# Patient Record
Sex: Female | Born: 1990 | Race: Black or African American | Hispanic: No | Marital: Single | Smoking: Never smoker
Health system: Southern US, Community
[De-identification: ages and names within clinical notes are randomized; demographics above are authoritative.]

## PROBLEM LIST (undated history)

## (undated) HISTORY — PX: FINGER SURGERY: SHX640

## (undated) HISTORY — PX: BREAST SURGERY: SHX581

---

## 2010-01-20 ENCOUNTER — Emergency Department (HOSPITAL_COMMUNITY)
Admission: EM | Admit: 2010-01-20 | Discharge: 2010-01-20 | Payer: Self-pay | Source: Home / Self Care | Admitting: Family Medicine

## 2010-01-27 ENCOUNTER — Encounter
Admission: RE | Admit: 2010-01-27 | Discharge: 2010-01-27 | Payer: Self-pay | Source: Home / Self Care | Attending: Family Medicine | Admitting: Family Medicine

## 2010-02-26 ENCOUNTER — Ambulatory Visit (HOSPITAL_BASED_OUTPATIENT_CLINIC_OR_DEPARTMENT_OTHER)
Admission: RE | Admit: 2010-02-26 | Discharge: 2010-02-26 | Disposition: A | Discharge status: Home / Self Care | Payer: PRIVATE HEALTH INSURANCE | Source: Ambulatory Visit | Attending: Surgery | Admitting: Surgery

## 2010-02-26 ENCOUNTER — Other Ambulatory Visit: Payer: Self-pay | Admitting: Surgery

## 2010-02-26 DIAGNOSIS — D249 Benign neoplasm of unspecified breast: Secondary | ICD-10-CM | POA: Insufficient documentation

## 2010-02-26 DIAGNOSIS — Z01812 Encounter for preprocedural laboratory examination: Secondary | ICD-10-CM | POA: Insufficient documentation

## 2010-03-05 NOTE — Op Note (Signed)
  [name], [name] NO.:  [id number]  MEDICAL RECORD NO.:  [id number]          PATIENT TYPE:  EMS  LOCATION:  URG                          FACILITY:  [facility]  PHYSICIAN:  Abigail Miyamoto, M.D. DATE OF BIRTH:  [birth year]  DATE OF PROCEDURE:  02/26/2010 DATE OF DISCHARGE:  01/20/2010                              OPERATIVE REPORT   PREOPERATIVE DIAGNOSIS:  Right breast mass x2.  POSTOPERATIVE DIAGNOSIS:  Right breast mass x2.  PROCEDURE:  Excision of right breast mass x2.  SURGEON:  Abigail Miyamoto, MD  ANESTHESIA:  LMA and 0.25% Marcaine.  ESTIMATED BLOOD LOSS:  Minimal.  INDICATIONS:  [name] is a very pleasant 20 year old female who presents with a mass at the 1 and 2 o'clock position of the right breast.  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  Decision has been made to remove these areas larger for histologic evaluation.  PROCEDURE IN DETAIL:  The patient was brought to the operating room, identified as Ebony Simmons.  She was placed supine on the operative room table and anesthesia was induced.  Her right breast was then prepped and draped in the usual sterile fashion.  The 2 masses were adjacent to each other, so I was able to anesthetize the skin and make a incision between the two transversely on the breast at 1 to 2 o'clock position of the right breast.  I took this down through the breast tissue with electrocautery.  The larger mass was removed first and again was consistent with fibroadenoma.  The small breast mass which is adjacent to this and taken out as well with the electrocautery.  Both masses were then sent together to pathology for evaluation.  I was able to achieve hemostasis with cautery.  I then closed the subcutaneous tissue with interrupted 3-0 Vicryl sutures and closed the skin with a running 4-0 Monocryl.  Steri-Strips, gauze and Tegaderm were then applied.  The patient tolerated  the procedure well.  All sponge, needle, and instrument counts were correct at the end of the procedure.  The patient was then extubated in the operating room and taken in stable condition to the recovery room.     Abigail Miyamoto, M.D.     DB/MEDQ  D:  02/26/2010  T:  02/27/2010  Job:  [id number]  Electronically Signed by Abigail Miyamoto M.D. on 03/05/2010 01:36:19 PM

## 2011-07-21 ENCOUNTER — Encounter (HOSPITAL_COMMUNITY): Payer: Self-pay

## 2011-07-21 ENCOUNTER — Emergency Department (INDEPENDENT_AMBULATORY_CARE_PROVIDER_SITE_OTHER)
Admission: EM | Admit: 2011-07-21 | Discharge: 2011-07-21 | Disposition: A | Discharge status: Home / Self Care | Payer: PRIVATE HEALTH INSURANCE | Source: Home / Self Care | Attending: Emergency Medicine | Admitting: Emergency Medicine

## 2011-07-21 DIAGNOSIS — H9203 Otalgia, bilateral: Secondary | ICD-10-CM

## 2011-07-21 DIAGNOSIS — H9209 Otalgia, unspecified ear: Secondary | ICD-10-CM

## 2011-07-21 MED ORDER — FEXOFENADINE HCL 60 MG PO TABS: 60.0000 mg | ORAL_TABLET | Freq: Two times a day (BID) | ORAL | Status: AC

## 2011-07-21 MED ORDER — IBUPROFEN 400 MG PO TABS: 400.0000 mg | ORAL_TABLET | Freq: Four times a day (QID) | ORAL | Status: AC | PRN

## 2011-07-21 NOTE — ED Provider Notes (Signed)
History     CSN: [id number]  Arrival date & time 07/21/11  1559   First MD Initiated Contact with Patient 07/21/11 1600      Chief Complaint  Patient presents with  . Otalgia    (Consider location/radiation/quality/duration/timing/severity/associated sxs/prior treatment) HPI Comments: Patient presents to your care complaining of increased discomfort in her right ear but also discomfort in her from her left ear that have been going on for about 3-4 weeks. Patient denies any fevers, tinnitus, vertigo, headaches. She also denies any recent swimming or cleaning her years frequently. Denies some upper congestion that has come and gone and denies using any over-the-counter medicines as of decongestions. Has taking one or 2 doses of Tylenol or Motrin for her ear discomfort. Came in today as her right ear was bothering her a bit more.  Patient is a 21 y.o. female presenting with ear pain. The history is provided by the patient.  Otalgia This is a new problem. The current episode started more than 1 week ago. There is pain in both ears. The problem occurs constantly. The problem has been gradually worsening. There has been no fever. Pertinent negatives include no ear discharge, no headaches, no hearing loss, no rhinorrhea, no diarrhea, no vomiting, no neck pain, no cough and no rash. Her past medical history does not include chronic ear infection, hearing loss or tympanostomy tube.    History reviewed. No pertinent past medical history.  Past Surgical History  Procedure Date  . Breast surgery     History reviewed. No pertinent family history.  History  Substance Use Topics  . Smoking status: Never Smoker   . Smokeless tobacco: Not on file  . Alcohol Use: No    OB History    Grav Para Term Preterm Abortions TAB SAB Ect Mult Living                  Review of Systems  Constitutional: Negative for activity change and appetite change.  HENT: Positive for ear pain. Negative for hearing  loss, congestion, rhinorrhea, sneezing, neck pain, postnasal drip, tinnitus and ear discharge.   Respiratory: Negative for cough.   Gastrointestinal: Negative for vomiting and diarrhea.  Genitourinary: Positive for dysuria.  Skin: Negative for rash.  Neurological: Negative for headaches.    Allergies  Review of patient's allergies indicates no known allergies.  Home Medications   Current Outpatient Rx  Name Route Sig Dispense Refill  . FEXOFENADINE HCL 60 MG PO TABS Oral Take 1 tablet (60 mg total) by mouth 2 (two) times daily. 14 tablet 0  . IBUPROFEN 400 MG PO TABS Oral Take 1 tablet (400 mg total) by mouth every 6 (six) hours as needed for pain. 30 tablet 0    BP 117/73  Pulse 65  Temp 98.1 F (36.7 C) (Oral)  Resp 16  SpO2 100%  LMP 07/13/2011  Physical Exam  Nursing note and vitals reviewed. Constitutional: She appears well-developed and well-nourished.  HENT:  Head: Normocephalic.  Right Ear: Hearing, tympanic membrane, external ear and ear canal normal. No decreased hearing is noted.  Left Ear: Tympanic membrane, external ear and ear canal normal. No decreased hearing is noted.  Eyes: EOM are normal. Pupils are equal, round, and reactive to light.  Neck: Neck supple. No JVD present.  Lymphadenopathy:    She has no cervical adenopathy.  Skin: No rash noted.    ED Course  Procedures (including critical care time)  Labs Reviewed - No data to display  No results found.   1. Otalgia of both ears       MDM  Bilateral otalgia with a normal ear exam. Have encouraged patient to use Zyrtec for 2 weeks and to followup with primary care Dr. Tenny Craw the pain was to persist. Suspect most likely otalgia could be related to congestion of in her ear as patient has been having some upper respiratory symptoms that have been intermittent.     Jimmie Molly, MD 07/21/11 2125

## 2011-07-21 NOTE — ED Notes (Signed)
C/o irritation and sensitivity to both ears for weeks- worse for the last few days.  States rt is worse than lt ear

## 2014-02-27 ENCOUNTER — Emergency Department (INDEPENDENT_AMBULATORY_CARE_PROVIDER_SITE_OTHER)
Admission: EM | Admit: 2014-02-27 | Discharge: 2014-02-27 | Disposition: A | Discharge status: Home / Self Care | Payer: Self-pay | Source: Home / Self Care | Attending: Family Medicine | Admitting: Family Medicine

## 2014-02-27 ENCOUNTER — Encounter (HOSPITAL_COMMUNITY): Payer: Self-pay | Admitting: *Deleted

## 2014-02-27 DIAGNOSIS — K529 Noninfective gastroenteritis and colitis, unspecified: Secondary | ICD-10-CM

## 2014-02-27 MED ORDER — ONDANSETRON 4 MG PO TBDP
8.0000 mg | ORAL_TABLET | Freq: Once | ORAL | Status: AC
  Administered 2014-02-27: 8 mg via ORAL

## 2014-02-27 MED ORDER — ONDANSETRON 4 MG PO TBDP
ORAL_TABLET | ORAL | Status: AC
  Filled 2014-02-27: qty 2

## 2014-02-27 MED ORDER — ONDANSETRON HCL 4 MG PO TABS: 4.0000 mg | ORAL_TABLET | Freq: Four times a day (QID) | ORAL | Status: DC

## 2014-02-27 MED ORDER — DIPHENOXYLATE-ATROPINE 2.5-0.025 MG PO TABS: 2.0000 | ORAL_TABLET | Freq: Four times a day (QID) | ORAL | Status: DC | PRN

## 2014-02-27 NOTE — ED Notes (Signed)
Fluids   Tolerated    well

## 2014-02-27 NOTE — Discharge Instructions (Signed)
Clear liquid , bland diet today as tolerated, advance on wed as improved, use medicine as needed, return or see your doctor if any problems.

## 2014-02-27 NOTE — ED Notes (Signed)
Pt   Reports      Nausea      Vomiting   Diarrhea      And  Low    abd  Cramping  Which  Started        Last  Pm

## 2014-02-27 NOTE — ED Provider Notes (Signed)
CSN: [id number]     Arrival date & time 02/27/14  3612 History   First MD Initiated Contact with Patient 02/27/14 1002     Chief Complaint  Patient presents with  . Diarrhea   (Consider location/radiation/quality/duration/timing/severity/associated sxs/prior Treatment) Patient is a 24 y.o. female presenting with vomiting. The history is provided by the patient.  Emesis Severity:  Mild Duration:  8 hours Quality:  Stomach contents Progression:  Partially resolved Chronicity:  New Recent urination:  Normal Context comment:  Pt believes related to sandwich eaten at [employer] last eve,was nauseated and didn't taste right., awoke with n/v/d. Relieved by:  Nothing Worsened by:  Nothing tried Associated symptoms: diarrhea   Associated symptoms: no abdominal pain   Risk factors: suspect food intake   Risk factors: no sick contacts   Risk factors comment:  Felt fine prior to onset.   History reviewed. No pertinent past medical history. Past Surgical History  Procedure Laterality Date  . Breast surgery     History reviewed. No pertinent family history. History  Substance Use Topics  . Smoking status: Never Smoker   . Smokeless tobacco: Not on file  . Alcohol Use: No   OB History    No data available     Review of Systems  Constitutional: Positive for appetite change. Negative for fever.  Cardiovascular: Negative.   Gastrointestinal: Positive for nausea, vomiting and diarrhea. Negative for abdominal pain and blood in stool.    Allergies  Review of patient's allergies indicates no known allergies.  Home Medications   Prior to Admission medications   Medication Sig Start Date End Date Taking? Authorizing Provider  diphenoxylate-atropine (LOMOTIL) 2.5-0.025 MG per tablet Take 2 tablets by mouth 4 (four) times daily as needed for diarrhea or loose stools. 02/27/14   Billy Fischer, MD  fexofenadine (ALLEGRA) 60 MG tablet Take 1 tablet (60 mg total) by mouth 2 (two) times daily.  07/21/11 07/20/12  Rosana Hoes, MD  ondansetron (ZOFRAN) 4 MG tablet Take 1 tablet (4 mg total) by mouth every 6 (six) hours. Prn n/v. 02/27/14   Billy Fischer, MD   BP 139/93 mmHg  Pulse 96  Temp(Src) 98.9 F (37.2 C) (Oral)  Resp 16  SpO2 99%  LMP 02/20/2014 (Exact Date) Physical Exam  Constitutional: She is oriented to person, place, and time. She appears well-developed and well-nourished. No distress.  HENT:  Head: Normocephalic.  Mouth/Throat: Oropharynx is clear and moist.  Eyes: Conjunctivae and EOM are normal. Pupils are equal, round, and reactive to light.  Neck: Normal range of motion. Neck supple.  Cardiovascular: Normal heart sounds.   Pulmonary/Chest: Effort normal and breath sounds normal.  Abdominal: Soft. Bowel sounds are normal. She exhibits no distension and no mass. There is no tenderness. There is no rebound and no guarding.  Lymphadenopathy:    She has no cervical adenopathy.  Neurological: She is alert and oriented to person, place, and time.  Skin: Skin is warm and dry.  Nursing note and vitals reviewed.   ED Course  Procedures (including critical care time) Labs Review Labs Reviewed - No data to display  Imaging Review No results found.   MDM   1. Gastroenteritis, acute    Tolerating po at time of d/c.    Billy Fischer, MD 02/27/14 1101

## 2014-12-05 ENCOUNTER — Emergency Department (HOSPITAL_COMMUNITY)
Admission: EM | Admit: 2014-12-05 | Discharge: 2014-12-05 | Disposition: A | Discharge status: Home / Self Care | Payer: Self-pay | Source: Home / Self Care | Attending: Emergency Medicine | Admitting: Emergency Medicine

## 2014-12-05 ENCOUNTER — Encounter (HOSPITAL_COMMUNITY): Payer: Self-pay | Admitting: Emergency Medicine

## 2014-12-05 DIAGNOSIS — L03019 Cellulitis of unspecified finger: Secondary | ICD-10-CM

## 2014-12-05 NOTE — Discharge Instructions (Signed)
You have a kind of infection in your finger. Please go directly to Dr. Luanna Cole office for additional evaluation.

## 2014-12-05 NOTE — ED Provider Notes (Signed)
CSN: [id number]     Arrival date & time 12/05/14  1408 History   First MD Initiated Contact with Patient 12/05/14 1534     Chief Complaint  Patient presents with  . Hand Pain  . Joint Swelling   (Consider location/radiation/quality/duration/timing/severity/associated sxs/prior Treatment) HPI  She is a 24 year old woman here for evaluation of right middle finger swelling and pain. She states it started yesterday with some swelling and erythema at the distal part of her finger. It has gotten much worse today. She is unable to extend her finger due to pain. She states the swelling and redness is extending down into the middle phalanx. She denies any fevers or chills. She does bite her nails.  History reviewed. No pertinent past medical history. Past Surgical History  Procedure Laterality Date  . Breast surgery     History reviewed. No pertinent family history. Social History  Substance Use Topics  . Smoking status: Never Smoker   . Smokeless tobacco: None  . Alcohol Use: No   OB History    No data available     Review of Systems As in history of present illness Allergies  Review of patient's allergies indicates no known allergies.  Home Medications   Prior to Admission medications   Medication Sig Start Date End Date Taking? Authorizing Provider  diphenoxylate-atropine (LOMOTIL) 2.5-0.025 MG per tablet Take 2 tablets by mouth 4 (four) times daily as needed for diarrhea or loose stools. 02/27/14   Billy Fischer, MD  fexofenadine (ALLEGRA) 60 MG tablet Take 1 tablet (60 mg total) by mouth 2 (two) times daily. 07/21/11 07/20/12  Rosana Hoes, MD  ondansetron (ZOFRAN) 4 MG tablet Take 1 tablet (4 mg total) by mouth every 6 (six) hours. Prn n/v. 02/27/14   Billy Fischer, MD   Meds Ordered and Administered this Visit  Medications - No data to display  BP 124/86 mmHg  Pulse 69  Temp(Src) 98.6 F (37 C) (Oral)  Resp 18  SpO2 100%  LMP 11/01/2014 No data found.   Physical Exam   Constitutional: She is oriented to person, place, and time. She appears well-developed and well-nourished. No distress.  Cardiovascular: Normal rate.   Pulmonary/Chest: Effort normal.  Musculoskeletal:  Right middle finger: There is significant erythema and swelling to the pulp of the finger. This is extending down over the palmar aspect of the middle phalanx. She is holding her finger in flexion. I am able to fully extend her finger, but with significant pain.  Neurological: She is alert and oriented to person, place, and time.    ED Course  Procedures (including critical care time)  Labs Review Labs Reviewed - No data to display  Imaging Review No results found.    MDM   1. Felon of finger    Discussed with Dr. Mardelle Matte, hand surgeon on call. Will see her in his office this afternoon. Provided directions and instructions to go directly to his office for evaluation of her infection.    Melony Overly, MD 12/05/14 1556

## 2014-12-05 NOTE — ED Notes (Signed)
The patient presented to the [facility] with the complaint of a swollen middle finger on her right hand. The patient stated that she did not remember any trauma to the hand or finger.

## 2015-04-13 ENCOUNTER — Emergency Department (INDEPENDENT_AMBULATORY_CARE_PROVIDER_SITE_OTHER)
Admission: EM | Admit: 2015-04-13 | Discharge: 2015-04-13 | Disposition: A | Discharge status: Home / Self Care | Payer: No Typology Code available for payment source | Source: Home / Self Care | Attending: Emergency Medicine | Admitting: Emergency Medicine

## 2015-04-13 ENCOUNTER — Encounter (HOSPITAL_COMMUNITY): Payer: Self-pay | Admitting: Emergency Medicine

## 2015-04-13 DIAGNOSIS — B354 Tinea corporis: Secondary | ICD-10-CM

## 2015-04-13 MED ORDER — CLOTRIMAZOLE-BETAMETHASONE 1-0.05 % EX CREA: TOPICAL_CREAM | CUTANEOUS | Status: AC

## 2015-04-13 NOTE — Discharge Instructions (Signed)
You have ringworm. Use the cream twice a day until the rash is resolved plus a few extra days.  This usually takes 1-2 weeks. The spot on your leg is likely just dry skin. Keep it covered when at work, but otherwise leave it open. Follow-up as needed.

## 2015-04-13 NOTE — ED Provider Notes (Signed)
CSN: [id number]     Arrival date & time 04/13/15  1306 History   First MD Initiated Contact with Patient 04/13/15 1327     Chief Complaint  Patient presents with  . Rash   (Consider location/radiation/quality/duration/timing/severity/associated sxs/prior Treatment) HPI  She is a 25 year old woman here for evaluation of rash. She reports a spot to her right temple that showed up a few days ago and has gradually been getting bigger. 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  She has been using OTC clotrimazole cream for a few days.  History reviewed. No pertinent past medical history. Past Surgical History  Procedure Laterality Date  . Breast surgery     No family history on file. Social History  Substance Use Topics  . Smoking status: Never Smoker   . Smokeless tobacco: None  . Alcohol Use: No   OB History    No data available     Review of Systems As in history of present illness Allergies  Review of patient's allergies indicates no known allergies.  Home Medications   Prior to Admission medications   Medication Sig Start Date End Date Taking? Authorizing Provider  clotrimazole-betamethasone (LOTRISONE) cream Apply to affected area 2 times daily 04/13/15   Melony Overly, MD  diphenoxylate-atropine (LOMOTIL) 2.5-0.025 MG per tablet Take 2 tablets by mouth 4 (four) times daily as needed for diarrhea or loose stools. 02/27/14   Billy Fischer, MD  fexofenadine (ALLEGRA) 60 MG tablet Take 1 tablet (60 mg total) by mouth 2 (two) times daily. 07/21/11 07/20/12  Rosana Hoes, MD  ondansetron (ZOFRAN) 4 MG tablet Take 1 tablet (4 mg total) by mouth every 6 (six) hours. Prn n/v. 02/27/14   Billy Fischer, MD   Meds Ordered and Administered this Visit  Medications - No data to display  BP 108/61 mmHg  Pulse 75  Temp(Src) 98.1 F (36.7 C) (Oral)  Resp 16  SpO2 100%  LMP 04/05/2015 No data found.   Physical Exam  Constitutional: She is oriented to person, place,  and time. She appears well-developed and well-nourished. No distress.  Cardiovascular: Normal rate.   Pulmonary/Chest: Effort normal.  Neurological: She is alert and oriented to person, place, and time.  Skin: Rash (she has a 0.5 x 1 cm annular lesion to right temple. The edges slightly erythematous with some central clearing. There are 2 punctate satellite lesions just inferior.Marland Kitchen) noted.  She also has a patch of dry scaly skin on her left inner thigh.    ED Course  Procedures (including critical care time)  Labs Review Labs Reviewed - No data to display  Imaging Review No results found.    MDM   1. Tinea corporis    Treat with clotrimazole/betamethasone cream. Discussed she will need to use this cream for 1-2 weeks before it resolves. Follow-up as needed.    Melony Overly, MD 04/13/15 [phone]

## 2015-04-13 NOTE — ED Notes (Signed)
C/o rash on right side of face and on left inner thigh onset x1 week.... 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 No acute distress.

## 2015-04-29 ENCOUNTER — Ambulatory Visit (HOSPITAL_COMMUNITY)
Admission: EM | Admit: 2015-04-29 | Discharge: 2015-04-29 | Disposition: A | Discharge status: Home / Self Care | Payer: No Typology Code available for payment source | Attending: Family Medicine | Admitting: Family Medicine

## 2015-04-29 ENCOUNTER — Encounter (HOSPITAL_COMMUNITY): Payer: Self-pay | Admitting: Emergency Medicine

## 2015-04-29 DIAGNOSIS — H109 Unspecified conjunctivitis: Secondary | ICD-10-CM

## 2015-04-29 MED ORDER — POLYMYXIN B-TRIMETHOPRIM 10000-0.1 UNIT/ML-% OP SOLN: 1.0000 [drp] | OPHTHALMIC | Status: DC

## 2015-04-29 NOTE — ED Notes (Signed)
The patient presented to the [facility] with a complaint of her right eye draining and itching since Saturday.

## 2015-04-29 NOTE — Discharge Instructions (Signed)
Bacterial Conjunctivitis °Bacterial conjunctivitis, commonly called pink eye, is an inflammation of the clear membrane that covers the white part of the eye (conjunctiva). The inflammation can also happen on the underside of the eyelids. The blood vessels in the conjunctiva become inflamed, causing the eye to become red or pink. Bacterial conjunctivitis may spread easily from one eye to another and from person to person (contagious).  °CAUSES  °Bacterial conjunctivitis is caused by bacteria. The bacteria may come from your own skin, your upper respiratory tract, or from someone else with bacterial conjunctivitis. °SYMPTOMS  °The normally white color of the eye or the underside of the eyelid is usually pink or red. The pink eye is usually associated with irritation, tearing, and some sensitivity to light. Bacterial conjunctivitis is often associated with a thick, yellowish discharge from the eye. The discharge may turn into a crust on the eyelids overnight, which causes your eyelids to stick together. If a discharge is present, there may also be some blurred vision in the affected eye. °DIAGNOSIS  °Bacterial conjunctivitis is diagnosed by your caregiver through an eye exam and the symptoms that you report. Your caregiver looks for changes in the surface tissues of your eyes, which may point to the specific type of conjunctivitis. A sample of any discharge may be collected on a cotton-tip swab if you have a severe case of conjunctivitis, if your cornea is affected, or if you keep getting repeat infections that do not respond to treatment. The sample will be sent to a lab to see if the inflammation is caused by a bacterial infection and to see if the infection will respond to antibiotic medicines. °TREATMENT  °1. Bacterial conjunctivitis is treated with antibiotics. Antibiotic eyedrops are most often used. However, antibiotic ointments are also available. Antibiotics pills are sometimes used. Artificial tears or eye  washes may ease discomfort. °HOME CARE INSTRUCTIONS  °1. To ease discomfort, apply a cool, clean washcloth to your eye for 10-20 minutes, 3-4 times a day. °2. Gently wipe away any drainage from your eye with a warm, wet washcloth or a cotton ball. °3. Wash your hands often with soap and water. Use paper towels to dry your hands. °4. Do not share towels or washcloths. This may spread the infection. °5. Change or wash your pillowcase every day. °6. You should not use eye makeup until the infection is gone. °7. Do not operate machinery or drive if your vision is blurred. °8. Stop using contact lenses. Ask your caregiver how to sterilize or replace your contacts before using them again. This depends on the type of contact lenses that you use. °9. When applying medicine to the infected eye, do not touch the edge of your eyelid with the eyedrop bottle or ointment tube. °SEEK IMMEDIATE MEDICAL CARE IF:  °· Your infection has not improved within 3 days after beginning treatment. °· You had yellow discharge from your eye and it returns. °· You have increased eye pain. °· Your eye redness is spreading. °· Your vision becomes blurred. °· You have a fever or persistent symptoms for more than 2-3 days. °· You have a fever and your symptoms suddenly get worse. °· You have facial pain, redness, or swelling. °MAKE SURE YOU:  °· Understand these instructions. °· Will watch your condition. °· Will get help right away if you are not doing well or get worse. °  °This information is not intended to replace advice given to you by your health care provider. Make sure you   discuss any questions you have with your health care provider. °  °Document Released: 12/29/2004 Document Revised: 01/19/2014 Document Reviewed: 06/01/2011 °Elsevier Interactive Patient Education ©2016 [employer] ° °How to Use Eye Drops and Eye Ointments °HOW TO APPLY EYE DROPS °Follow these steps when applying eye drops: °2. Wash your hands. °3. Tilt your head  back. °4. Put a finger under your eye and use it to gently pull your lower lid downward. Keep that finger in place. °5. Using your other hand, hold the dropper between your thumb and index finger. °6. Position the dropper just over the edge of the lower lid. Hold it as close to your eye as you can without touching the dropper to your eye. °7. Steady your hand. One way to do this is to lean your index finger against your brow. °8. Look up. °9. Slowly and gently squeeze one drop of medicine into your eye. °10. Close your eye. °11. Place a finger between your lower eyelid and your nose. Press gently for 2 minutes. This increases the amount of time that the medicine is exposed to the eye. It also reduces side effects that can develop if the drop gets into the bloodstream through the nose. °HOW TO APPLY EYE OINTMENTS °Follow these steps when applying eye ointments: °10. Wash your hands. °11. Put a finger under your eye and use it to gently pull your lower lid downward. Keep that finger in place. °12. Using your other hand, place the tip of the tube between your thumb and index finger with the remaining fingers braced against your cheek or nose. °13. Hold the tube just over the edge of your lower lid without touching the tube to your lid or eyeball. °14. Look up. °15. Line the inner part of your lower lid with ointment. °16. Gently pull up on your upper lid and look down. This will force the ointment to spread over the surface of the eye. °17. Release the upper lid. °18. If you can, close your eyes for 1-2 minutes. °Do not rub your eyes. If you applied the ointment correctly, your vision will be blurry for a few minutes. This is normal. °ADDITIONAL INFORMATION °· Make sure to use the eye drops or ointment as told by your health care provider. °· If you have been told to use both eye drops and an eye ointment, apply the eye drops first, then wait 3-4 minutes before you apply the ointment. °· Try not to touch the tip of the  dropper or tube to your eye. A dropper or tube that has touched the eye can become contaminated. °  °This information is not intended to replace advice given to you by your health care provider. Make sure you discuss any questions you have with your health care provider. °  °Document Released: 04/06/2000 Document Revised: 05/15/2014 Document Reviewed: 12/25/2013 °Elsevier Interactive Patient Education ©2016 [employer] ° °

## 2015-04-30 NOTE — ED Provider Notes (Signed)
CSN: [id number]     Arrival date & time 04/29/15  1923 History   First MD Initiated Contact with Patient 04/29/15 2043     Chief Complaint  Patient presents with  . 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 Is here for treatment, right eye very itchy. No acute vision change.  History reviewed. No pertinent past medical history. Past Surgical History  Procedure Laterality Date  . Breast surgery     History reviewed. No pertinent family history. Social History  Substance Use Topics  . Smoking status: Never Smoker   . Smokeless tobacco: None  . Alcohol Use: No   OB History    No data available     Review of Systems Pink eye Allergies  Review of patient's allergies indicates no known allergies.  Home Medications   Prior to Admission medications   Medication Sig Start Date End Date Taking? Authorizing Provider  clotrimazole-betamethasone (LOTRISONE) cream Apply to affected area 2 times daily 04/13/15   Melony Overly, MD  diphenoxylate-atropine (LOMOTIL) 2.5-0.025 MG per tablet Take 2 tablets by mouth 4 (four) times daily as needed for diarrhea or loose stools. 02/27/14   Billy Fischer, MD  fexofenadine (ALLEGRA) 60 MG tablet Take 1 tablet (60 mg total) by mouth 2 (two) times daily. 07/21/11 07/20/12  Rosana Hoes, MD  ondansetron (ZOFRAN) 4 MG tablet Take 1 tablet (4 mg total) by mouth every 6 (six) hours. Prn n/v. 02/27/14   Billy Fischer, MD  trimethoprim-polymyxin b (POLYTRIM) ophthalmic solution Place 1 drop into the right eye every 4 (four) hours. 04/29/15   Konrad Felix, PA   Meds Ordered and Administered this Visit  Medications - No data to display  BP 118/67 mmHg  Pulse 70  Temp(Src) 98.8 F (37.1 C) (Oral)  Resp 18  SpO2 98%  LMP 04/05/2015 No data found.   Physical Exam NURSES NOTES AND VITAL SIGNS  REVIEWED. CONSTITUTIONAL: Well developed, well nourished, no acute distress HEENT: normocephalic, atraumatic EYES: Left Conjunctiva normal Right eye red crusty NECK:normal ROM, supple, no adenopathy PULMONARY:No respiratory distress, normal effort MUSCULOSKELETAL: Normal ROM of all extremities,  SKIN: warm and dry without rash PSYCHIATRIC: Mood and affect, behavior are normal  ED Course  Procedures (including critical care time)  Labs Review Labs Reviewed - No data to display  Imaging Review No results found.   Visual Acuity Review  Right Eye Distance:   Left Eye Distance:   Bilateral Distance:    Right Eye Near:   Left Eye Near:    Bilateral Near:     RX polytrim    MDM   1. Conjunctivitis of right eye     Patient is reassured that there are no issues that require transfer to higher level of care at this time or additional tests. Patient is advised to continue home symptomatic treatment. Patient is advised that if there are new or worsening symptoms to attend the emergency department, contact primary care provider, or return to UC. Instructions of care provided discharged home in stable condition.    THIS NOTE WAS GENERATED USING A VOICE RECOGNITION SOFTWARE PROGRAM. ALL REASONABLE EFFORTS  WERE MADE TO PROOFREAD THIS DOCUMENT FOR ACCURACY.  I have verbally reviewed the discharge instructions with the patient. A printed AVS was given to the patient.  All questions were answered prior to discharge.      Pilar Grammes  [place], [place] 04/30/15 [phone]

## 2015-06-17 ENCOUNTER — Encounter (HOSPITAL_COMMUNITY): Payer: Self-pay | Admitting: *Deleted

## 2015-06-17 ENCOUNTER — Ambulatory Visit (HOSPITAL_COMMUNITY)
Admission: EM | Admit: 2015-06-17 | Discharge: 2015-06-17 | Disposition: A | Discharge status: Home / Self Care | Payer: No Typology Code available for payment source | Attending: Family Medicine | Admitting: Family Medicine

## 2015-06-17 DIAGNOSIS — J0101 Acute recurrent maxillary sinusitis: Secondary | ICD-10-CM | POA: Diagnosis not present

## 2015-06-17 DIAGNOSIS — H6091 Unspecified otitis externa, right ear: Secondary | ICD-10-CM

## 2015-06-17 MED ORDER — NEOMYCIN-POLYMYXIN-HC 3.5-10000-1 OT SUSP: 4.0000 [drp] | Freq: Three times a day (TID) | OTIC | Status: DC

## 2015-06-17 MED ORDER — DOXYCYCLINE HYCLATE 100 MG PO CAPS: 100.0000 mg | ORAL_CAPSULE | Freq: Two times a day (BID) | ORAL | Status: DC

## 2015-06-17 MED ORDER — IPRATROPIUM BROMIDE 0.06 % NA SOLN: 2.0000 | Freq: Four times a day (QID) | NASAL | Status: AC

## 2015-06-17 NOTE — ED Provider Notes (Signed)
CSN: [id number]     Arrival date & time 06/17/15  1757 History   First MD Initiated Contact with Patient 06/17/15 1825     Chief Complaint  Patient presents with  . Otalgia   (Consider location/radiation/quality/duration/timing/severity/associated sxs/prior Treatment) Patient is a 25 y.o. female presenting with ear pain. The history is provided by the patient.  Otalgia Location:  Right Behind ear:  No abnormality Quality:  Burning and sore Severity:  Mild Onset quality:  Gradual Duration:  2 weeks Chronicity:  New Context comment:  Cleaning ear with bobbypin Relieved by:  None tried Worsened by:  Nothing tried Ineffective treatments:  None tried Associated symptoms: congestion and rhinorrhea   Associated symptoms: no ear discharge and no fever     History reviewed. No pertinent past medical history. Past Surgical History  Procedure Laterality Date  . Breast surgery     History reviewed. No pertinent family history. Social History  Substance Use Topics  . Smoking status: Never Smoker   . Smokeless tobacco: None  . Alcohol Use: No   OB History    No data available     Review of Systems  Constitutional: Negative.  Negative for fever.  HENT: Positive for congestion, ear pain, postnasal drip and rhinorrhea. Negative for ear discharge and sinus pressure.   All other systems reviewed and are negative.   Allergies  Review of patient's allergies indicates no known allergies.  Home Medications   Prior to Admission medications   Medication Sig Start Date End Date Taking? Authorizing Provider  clotrimazole-betamethasone (LOTRISONE) cream Apply to affected area 2 times daily 04/13/15   Melony Overly, MD  diphenoxylate-atropine (LOMOTIL) 2.5-0.025 MG per tablet Take 2 tablets by mouth 4 (four) times daily as needed for diarrhea or loose stools. 02/27/14   Billy Fischer, MD  doxycycline (VIBRAMYCIN) 100 MG capsule Take 1 capsule (100 mg total) by mouth 2 (two) times daily. 06/17/15    Billy Fischer, MD  fexofenadine (ALLEGRA) 60 MG tablet Take 1 tablet (60 mg total) by mouth 2 (two) times daily. 07/21/11 07/20/12  Rosana Hoes, MD  ipratropium (ATROVENT) 0.06 % nasal spray Place 2 sprays into both nostrils 4 (four) times daily. 06/17/15   Billy Fischer, MD  neomycin-polymyxin-hydrocortisone (CORTISPORIN) 3.5-10000-1 otic suspension Place 4 drops into the right ear 3 (three) times daily. 06/17/15   Billy Fischer, MD  ondansetron (ZOFRAN) 4 MG tablet Take 1 tablet (4 mg total) by mouth every 6 (six) hours. Prn n/v. 02/27/14   Billy Fischer, MD  trimethoprim-polymyxin b (POLYTRIM) ophthalmic solution Place 1 drop into the right eye every 4 (four) hours. 04/29/15   Konrad Felix, PA   Meds Ordered and Administered this Visit  Medications - No data to display  BP 120/84 mmHg  Pulse 78  Temp(Src) 98.6 F (37 C) (Oral)  Resp 18  SpO2 100%  LMP 05/17/2015 No data found.   Physical Exam  Constitutional: She is oriented to person, place, and time. She appears well-developed and well-nourished.  HENT:  Right Ear: Tympanic membrane normal. There is tenderness. Tympanic membrane is not injected, not perforated and not erythematous.  Left Ear: Hearing, tympanic membrane, external ear and ear canal normal.  Mouth/Throat: No oropharyngeal exudate.  Eyes: Pupils are equal, round, and reactive to light.  Neck: Normal range of motion. Neck supple.  Lymphadenopathy:    She has no cervical adenopathy.  Neurological: She is alert and oriented to person, place, and time.  Skin: Skin is warm and dry.  Nursing note and vitals reviewed.   ED Course  Procedures (including critical care time)  Labs Review Labs Reviewed - No data to display  Imaging Review No results found.   Visual Acuity Review  Right Eye Distance:   Left Eye Distance:   Bilateral Distance:    Right Eye Near:   Left Eye Near:    Bilateral Near:         MDM   1. External otitis of right ear   2. Acute  recurrent maxillary sinusitis        Billy Fischer, MD 06/17/15 [phone]

## 2015-06-17 NOTE — ED Notes (Signed)
Pt  Reports earache       Since  Last  Week  As   Well  As   Nasal  Congestion  /  Sinus  Drainage          Symptoms   Since  Last      Week    Pt  Sitting  Upright on  The  Exam table  Speaking in  Complete  sentances  And is in no  Acute  Distress

## 2015-11-10 ENCOUNTER — Encounter (HOSPITAL_COMMUNITY): Payer: Self-pay | Admitting: *Deleted

## 2015-11-10 ENCOUNTER — Ambulatory Visit (HOSPITAL_COMMUNITY)
Admission: EM | Admit: 2015-11-10 | Discharge: 2015-11-10 | Disposition: A | Discharge status: Home / Self Care | Payer: Self-pay | Attending: Internal Medicine | Admitting: Internal Medicine

## 2015-11-10 DIAGNOSIS — J069 Acute upper respiratory infection, unspecified: Secondary | ICD-10-CM

## 2015-11-10 DIAGNOSIS — R6889 Other general symptoms and signs: Secondary | ICD-10-CM

## 2015-11-10 NOTE — ED Provider Notes (Signed)
CSN: [id number]     Arrival date & time 11/10/15  1555 History   None    Chief Complaint  Patient presents with  . URI   (Consider location/radiation/quality/duration/timing/severity/associated sxs/prior Treatment) HPI NP THIS IS A NEW PROBLEM PT PRESENTS WITH SEVERAL DAY HISTORY OF CONGESTION, RUNNY NOSE, COUGH AND WHEEZING. HAS USED MULTIPLE OTC MEDS WITHOUT RELIEF OF SYMPTOMS. DENIES FEVER. COUGH IS DRY NON PRODUCTIVE. NON SMOKER.  PREVIOUS SYMPTOMS OF THIS NATURE. OTHERS AROUND PATIENT HAS BEEN ILL. SOME CHEST PAIN DUE TO COUGH. PAIN SCORE 2.    History reviewed. No pertinent past medical history. Past Surgical History:  Procedure Laterality Date  . BREAST SURGERY     History reviewed. No pertinent family history. Social History  Substance Use Topics  . Smoking status: Never Smoker  . Smokeless tobacco: Not on file  . Alcohol use No   OB History    No data available     Review of Systems  Denies: HEADACHE, NAUSEA, ABDOMINAL PAIN, CHEST PAIN, CONGESTION, DYSURIA, SHORTNESS OF BREATH  Allergies  Review of patient's allergies indicates no known allergies.  Home Medications   Prior to Admission medications   Medication Sig Start Date End Date Taking? Authorizing Provider  clotrimazole-betamethasone (LOTRISONE) cream Apply to affected area 2 times daily 04/13/15   Melony Overly, MD  diphenoxylate-atropine (LOMOTIL) 2.5-0.025 MG per tablet Take 2 tablets by mouth 4 (four) times daily as needed for diarrhea or loose stools. 02/27/14   Billy Fischer, MD  doxycycline (VIBRAMYCIN) 100 MG capsule Take 1 capsule (100 mg total) by mouth 2 (two) times daily. 06/17/15   Billy Fischer, MD  fexofenadine (ALLEGRA) 60 MG tablet Take 1 tablet (60 mg total) by mouth 2 (two) times daily. 07/21/11 07/20/12  Rosana Hoes, MD  ipratropium (ATROVENT) 0.06 % nasal spray Place 2 sprays into both nostrils 4 (four) times daily. 06/17/15   Billy Fischer, MD  neomycin-polymyxin-hydrocortisone (CORTISPORIN)  3.5-10000-1 otic suspension Place 4 drops into the right ear 3 (three) times daily. 06/17/15   Billy Fischer, MD  ondansetron (ZOFRAN) 4 MG tablet Take 1 tablet (4 mg total) by mouth every 6 (six) hours. Prn n/v. 02/27/14   Billy Fischer, MD  trimethoprim-polymyxin b (POLYTRIM) ophthalmic solution Place 1 drop into the right eye every 4 (four) hours. 04/29/15   Konrad Felix, PA   Meds Ordered and Administered this Visit  Medications - No data to display  BP 114/82 (BP Location: Right Arm)   Pulse 82   Temp 98.4 F (36.9 C) (Oral)   Resp 16   LMP 10/11/2015   SpO2 100%  No data found.   Physical Exam NURSES NOTES AND VITAL SIGNS REVIEWED. CONSTITUTIONAL: Well developed, well nourished, no acute distress HEENT: normocephalic, atraumatic EYES: Conjunctiva normal NECK:normal ROM, supple, no adenopathy PULMONARY:No respiratory distress, normal effort ABDOMINAL: Soft, ND, NT BS+, No CVAT MUSCULOSKELETAL: Normal ROM of all extremities,  SKIN: warm and dry without rash PSYCHIATRIC: Mood and affect, behavior are normal  Urgent Care Course   Clinical Course    Procedures (including critical care time)  Labs Review Labs Reviewed - No data to display  Imaging Review No results found.   Visual Acuity Review  Right Eye Distance:   Left Eye Distance:   Bilateral Distance:    Right Eye Near:   Left Eye Near:    Bilateral Near:         MDM   1. Acute upper respiratory infection  2. Flu-like symptoms     Patient is reassured that there are no issues that require transfer to higher level of care at this time or additional tests. Patient is advised to continue home symptomatic treatment. Patient is advised that if there are new or worsening symptoms to attend the emergency department, contact primary care provider, or return to UC. Instructions of care provided discharged home in stable condition.    THIS NOTE WAS GENERATED USING A VOICE RECOGNITION SOFTWARE PROGRAM.  ALL REASONABLE EFFORTS  WERE MADE TO PROOFREAD THIS DOCUMENT FOR ACCURACY.  I have verbally reviewed the discharge instructions with the patient. A printed AVS was given to the patient.  All questions were answered prior to discharge.      Konrad Felix, PA 11/10/15 2100

## 2015-11-10 NOTE — ED Triage Notes (Signed)
r  Earache    Chills         Headache     bodyaches     Slight   sorethroat     Symptoms    Started  Yesterday

## 2015-11-10 NOTE — Discharge Instructions (Signed)
SYMPTOMATIC TREATMENT AT HOME  PUSH FLUIDS,   MOTRIN AS NEEDED FOR BODY ACHES.

## 2015-12-22 ENCOUNTER — Ambulatory Visit (HOSPITAL_COMMUNITY)
Admission: EM | Admit: 2015-12-22 | Discharge: 2015-12-22 | Disposition: A | Discharge status: Home / Self Care | Payer: Self-pay | Attending: Family Medicine | Admitting: Family Medicine

## 2015-12-22 ENCOUNTER — Encounter (HOSPITAL_COMMUNITY): Payer: Self-pay | Admitting: Emergency Medicine

## 2015-12-22 DIAGNOSIS — K047 Periapical abscess without sinus: Secondary | ICD-10-CM

## 2015-12-22 MED ORDER — CLINDAMYCIN HCL 150 MG PO CAPS: 150.0000 mg | ORAL_CAPSULE | Freq: Four times a day (QID) | ORAL | 0 refills | Status: DC

## 2015-12-22 NOTE — ED Provider Notes (Signed)
[facility]    CSN: [id number] Arrival date & time: 12/22/15  1632     History   Chief Complaint Chief Complaint  Patient presents with  . Dental Pain    HPI Ebony Simmons is a 25 y.o. female.   The history is provided by the patient and a parent.  Dental Pain  Location:  Lower Lower teeth location:  18/LL 2nd molar Quality:  Throbbing Severity:  Moderate Onset quality:  Gradual Duration:  2 days Progression:  Worsening Chronicity:  New Context: abscess   Relieved by:  None tried Worsened by:  Nothing Ineffective treatments:  None tried Associated symptoms: facial swelling and gum swelling   Associated symptoms: no fever     History reviewed. No pertinent past medical history.  There are no active problems to display for this patient.   Past Surgical History:  Procedure Laterality Date  . BREAST SURGERY      OB History    No data available       Home Medications    Prior to Admission medications   Medication Sig Start Date End Date Taking? Authorizing Provider  clotrimazole-betamethasone (LOTRISONE) cream Apply to affected area 2 times daily 04/13/15   Melony Overly, MD  diphenoxylate-atropine (LOMOTIL) 2.5-0.025 MG per tablet Take 2 tablets by mouth 4 (four) times daily as needed for diarrhea or loose stools. 02/27/14   Billy Fischer, MD  doxycycline (VIBRAMYCIN) 100 MG capsule Take 1 capsule (100 mg total) by mouth 2 (two) times daily. 06/17/15   Billy Fischer, MD  fexofenadine (ALLEGRA) 60 MG tablet Take 1 tablet (60 mg total) by mouth 2 (two) times daily. 07/21/11 07/20/12  Rosana Hoes, MD  ipratropium (ATROVENT) 0.06 % nasal spray Place 2 sprays into both nostrils 4 (four) times daily. 06/17/15   Billy Fischer, MD  neomycin-polymyxin-hydrocortisone (CORTISPORIN) 3.5-10000-1 otic suspension Place 4 drops into the right ear 3 (three) times daily. 06/17/15   Billy Fischer, MD  ondansetron (ZOFRAN) 4 MG tablet Take 1 tablet (4 mg total) by mouth every 6  (six) hours. Prn n/v. 02/27/14   Billy Fischer, MD  trimethoprim-polymyxin b (POLYTRIM) ophthalmic solution Place 1 drop into the right eye every 4 (four) hours. 04/29/15   Konrad Felix, PA    Family History No family history on file.  Social History Social History  Substance Use Topics  . Smoking status: Never Smoker  . Smokeless tobacco: Never Used  . Alcohol use No     Allergies   Patient has no known allergies.   Review of Systems Review of Systems  Constitutional: Negative for fever.  HENT: Positive for dental problem and facial swelling.   All other systems reviewed and are negative.    Physical Exam Triage Vital Signs ED Triage Vitals  Enc Vitals Group     BP 12/22/15 1658 126/83     Pulse Rate 12/22/15 1658 86     Resp 12/22/15 1658 16     Temp 12/22/15 1658 97.5 F (36.4 C)     Temp Source 12/22/15 1658 Oral     SpO2 12/22/15 1658 100 %     Weight 12/22/15 1659 110 lb (49.9 kg)     Height 12/22/15 1659 5\' 6"  (1.676 m)     Head Circumference --      Peak Flow --      Pain Score 12/22/15 1700 10     Pain Loc --  Pain Edu? --      Excl. in [place]? --    No data found.   Updated Vital Signs BP 126/83   Pulse 86   Temp 97.5 F (36.4 C) (Oral)   Resp 16   Ht 5\' 6"  (1.676 m)   Wt 110 lb (49.9 kg)   LMP 12/11/2015   SpO2 100%   BMI 17.75 kg/m   Visual Acuity Right Eye Distance:   Left Eye Distance:   Bilateral Distance:    Right Eye Near:   Left Eye Near:    Bilateral Near:     Physical Exam  Constitutional: She appears well-developed and well-nourished. She appears distressed.  HENT:  Mouth/Throat: Oropharynx is clear and moist and mucous membranes are normal. Dental abscesses present.       UC Treatments / Results  Labs (all labs ordered are listed, but only abnormal results are displayed) Labs Reviewed - No data to display  EKG  EKG Interpretation None       Radiology No results found.  Procedures Procedures  (including critical care time)  Medications Ordered in UC Medications - No data to display   Initial Impression / Assessment and Plan / UC Course  I have reviewed the triage vital signs and the nursing notes.  Pertinent labs & imaging results that were available during my care of the patient were reviewed by me and considered in my medical decision making (see chart for details).  Clinical Course       Final Clinical Impressions(s) / UC Diagnoses   Final diagnoses:  None    New Prescriptions New Prescriptions   No medications on file     Billy Fischer, MD 12/22/15 1746

## 2015-12-22 NOTE — ED Triage Notes (Signed)
PT reports severe pain in left lower jaw that radiates up to left ear. PT reports an abscess over gum in affected area that is draining. PT reports the abscess has been there for a "while"

## 2015-12-22 NOTE — Discharge Instructions (Signed)
Take medicine as prescribed, see your dentist as soon as possible °

## 2015-12-27 ENCOUNTER — Ambulatory Visit (HOSPITAL_COMMUNITY)
Admission: EM | Admit: 2015-12-27 | Discharge: 2015-12-27 | Disposition: A | Discharge status: Home / Self Care | Payer: No Typology Code available for payment source | Attending: Internal Medicine | Admitting: Internal Medicine

## 2015-12-27 ENCOUNTER — Encounter (HOSPITAL_COMMUNITY): Payer: Self-pay | Admitting: Emergency Medicine

## 2015-12-27 DIAGNOSIS — K047 Periapical abscess without sinus: Secondary | ICD-10-CM

## 2015-12-27 MED ORDER — AMOXICILLIN 875 MG PO TABS: 875.0000 mg | ORAL_TABLET | Freq: Two times a day (BID) | ORAL | 0 refills | Status: DC

## 2015-12-27 NOTE — ED Triage Notes (Signed)
The patient presented to the [facility] with a complaint of oral swelling that started 6 days. The patient reported that she was here on 12/22/2015 for a dental abscess and was prescribed clindamycin. The patient reported that she took the medicine for 4 days and began to have swelling in her mouth and soft tissue areas and was advised by a pharmacist to stop the medicine.

## 2015-12-27 NOTE — ED Provider Notes (Signed)
CSN: [id number]     Arrival date & time 12/27/15  1048 History   First MD Initiated Contact with Patient 12/27/15 1210     Chief Complaint  Patient presents with  . Oral Swelling   (Consider location/radiation/quality/duration/timing/severity/associated sxs/prior Treatment) Patient states she was tx'd for dental abscess and was rx'd clindamycin and her jaw swelled up more and then she has pain in her mouth and she was advised to stop the clindamycin and she states she has felt better.   The history is provided by the patient.  Dental Pain  Location:  Upper and lower Lower teeth location:  18/LL 2nd molar Quality:  Aching Severity:  Moderate Onset quality:  Sudden Duration:  4 days Timing:  Constant Progression:  Improving Chronicity:  New Context: abscess   Previous work-up:  Dental exam Relieved by:  Nothing Worsened by:  Nothing   History reviewed. No pertinent past medical history. Past Surgical History:  Procedure Laterality Date  . BREAST SURGERY     History reviewed. No pertinent family history. Social History  Substance Use Topics  . Smoking status: Never Smoker  . Smokeless tobacco: Never Used  . Alcohol use No   OB History    No data available     Review of Systems  Constitutional: Negative.   HENT: Positive for dental problem.   Eyes: Negative.   Respiratory: Negative.   Cardiovascular: Negative.   Gastrointestinal: Negative.   Endocrine: Negative.   Genitourinary: Negative.   Musculoskeletal: Negative.   Skin: Negative.   Allergic/Immunologic: Negative.   Neurological: Negative.   Hematological: Negative.   Psychiatric/Behavioral: Negative.     Allergies  Patient has no known allergies.  Home Medications   Prior to Admission medications   Medication Sig Start Date End Date Taking? Authorizing Provider  clindamycin (CLEOCIN) 150 MG capsule Take 1 capsule (150 mg total) by mouth 4 (four) times daily. 12/22/15  Yes Billy Fischer, MD   amoxicillin (AMOXIL) 875 MG tablet Take 1 tablet (875 mg total) by mouth 2 (two) times daily. 12/27/15   Lysbeth Penner, FNP  clotrimazole-betamethasone (LOTRISONE) cream Apply to affected area 2 times daily 04/13/15   Melony Overly, MD  diphenoxylate-atropine (LOMOTIL) 2.5-0.025 MG per tablet Take 2 tablets by mouth 4 (four) times daily as needed for diarrhea or loose stools. 02/27/14   Billy Fischer, MD  doxycycline (VIBRAMYCIN) 100 MG capsule Take 1 capsule (100 mg total) by mouth 2 (two) times daily. 06/17/15   Billy Fischer, MD  fexofenadine (ALLEGRA) 60 MG tablet Take 1 tablet (60 mg total) by mouth 2 (two) times daily. 07/21/11 07/20/12  Rosana Hoes, MD  ipratropium (ATROVENT) 0.06 % nasal spray Place 2 sprays into both nostrils 4 (four) times daily. 06/17/15   Billy Fischer, MD  neomycin-polymyxin-hydrocortisone (CORTISPORIN) 3.5-10000-1 otic suspension Place 4 drops into the right ear 3 (three) times daily. 06/17/15   Billy Fischer, MD  ondansetron (ZOFRAN) 4 MG tablet Take 1 tablet (4 mg total) by mouth every 6 (six) hours. Prn n/v. 02/27/14   Billy Fischer, MD  trimethoprim-polymyxin b (POLYTRIM) ophthalmic solution Place 1 drop into the right eye every 4 (four) hours. 04/29/15   Konrad Felix, PA   Meds Ordered and Administered this Visit  Medications - No data to display  BP 114/71 (BP Location: Left Arm)   Pulse 61   Temp 98.4 F (36.9 C) (Oral)   Resp 16   LMP 12/11/2015  SpO2 100%  No data found.   Physical Exam  Constitutional: She appears well-developed and well-nourished.  HENT:  Head: Normocephalic and atraumatic.  Mouth/Throat: Oropharynx is clear and moist.  TTP left lower molar.  Eyes: EOM are normal. Pupils are equal, round, and reactive to light.  Neck: Normal range of motion. Neck supple.  Cardiovascular: Normal rate, regular rhythm and normal heart sounds.   Pulmonary/Chest: Effort normal and breath sounds normal.  Nursing note and vitals reviewed.   Urgent Care  Course   Clinical Course     Procedures (including critical care time)  Labs Review Labs Reviewed - No data to display  Imaging Review No results found.   Visual Acuity Review  Right Eye Distance:   Left Eye Distance:   Bilateral Distance:    Right Eye Near:   Left Eye Near:    Bilateral Near:         MDM   1. Dental abscess    Amoxicillin 875mg  one po bid x 7 days Follow up with Dentist      Lysbeth Penner, FNP 12/27/15 1700

## 2016-02-13 ENCOUNTER — Emergency Department (HOSPITAL_COMMUNITY): Payer: No Typology Code available for payment source

## 2016-02-13 ENCOUNTER — Encounter (HOSPITAL_COMMUNITY): Payer: Self-pay

## 2016-02-13 DIAGNOSIS — Z79899 Other long term (current) drug therapy: Secondary | ICD-10-CM | POA: Insufficient documentation

## 2016-02-13 DIAGNOSIS — M940 Chondrocostal junction syndrome [Tietze]: Secondary | ICD-10-CM | POA: Insufficient documentation

## 2016-02-13 LAB — CBC
HEMATOCRIT: 35.8 % — AB (ref 36.0–46.0)
HEMOGLOBIN: 11.4 g/dL — AB (ref 12.0–15.0)
MCH: 25.1 pg — ABNORMAL LOW (ref 26.0–34.0)
MCHC: 31.8 g/dL (ref 30.0–36.0)
MCV: 78.7 fL (ref 78.0–100.0)
Platelets: 318 10*3/uL (ref 150–400)
RBC: 4.55 MIL/uL (ref 3.87–5.11)
RDW: 14 % (ref 11.5–15.5)
WBC: 4.9 10*3/uL (ref 4.0–10.5)

## 2016-02-13 LAB — BASIC METABOLIC PANEL
ANION GAP: 7 (ref 5–15)
BUN: 20 mg/dL (ref 6–20)
CO2: 27 mmol/L (ref 22–32)
Calcium: 9.3 mg/dL (ref 8.9–10.3)
Chloride: 104 mmol/L (ref 101–111)
Creatinine, Ser: 0.92 mg/dL (ref 0.44–1.00)
GLUCOSE: 129 mg/dL — AB (ref 65–99)
POTASSIUM: 3.8 mmol/L (ref 3.5–5.1)
Sodium: 138 mmol/L (ref 135–145)

## 2016-02-13 LAB — I-STAT TROPONIN, ED: Troponin i, poc: 0 ng/mL (ref 0.00–0.08)

## 2016-02-13 NOTE — ED Triage Notes (Signed)
Pt presents with c/o chest pain that started around 8 am. Pt denies any N/V/D, also denies shortness of breath or dizziness. Pt describes the pain as heaviness in the center of her chest.

## 2016-02-14 ENCOUNTER — Emergency Department (HOSPITAL_COMMUNITY)
Admission: EM | Admit: 2016-02-14 | Discharge: 2016-02-14 | Disposition: A | Discharge status: Home / Self Care | Payer: No Typology Code available for payment source | Attending: Emergency Medicine | Admitting: Emergency Medicine

## 2016-02-14 DIAGNOSIS — R0789 Other chest pain: Secondary | ICD-10-CM

## 2016-02-14 DIAGNOSIS — R071 Chest pain on breathing: Secondary | ICD-10-CM

## 2016-02-14 MED ORDER — IBUPROFEN 600 MG PO TABS: 600.0000 mg | ORAL_TABLET | Freq: Four times a day (QID) | ORAL | 0 refills | Status: AC | PRN

## 2016-02-14 NOTE — ED Provider Notes (Signed)
[facility] DEPT Provider Note   CSN: [id number] Arrival date & time: 02/13/16  2128 By signing my name below, I, Georgette Shell, attest that this documentation has been prepared under the direction and in the presence of Charlann Lange, PA-C. Electronically Signed: Georgette Shell, ED Scribe. 02/14/16. 1:36 AM.  History   Chief Complaint Chief Complaint  Patient presents with  . Chest Pain    HPI The history is provided by the patient. No language interpreter was used.   HPI Comments: Ebony Simmons is a 26 y.o. female with no pertinent PMHx, who presents to the Emergency Department complaining of constant, 5/10 chest discomfort onset ~8am yesterday. Pt states pain has a "heavy" quality. Denies any modifying factors. Pt notes she has had this pain before but it usually resolves on it's own. She further reports that she has had a mass on her breast several years ago that she had to have removed; she relates her current pain to this but denies any new masses to her breast. Pt denies fever, chills, cough, shortness of breath, nausea, vomiting, diarrhea, dizziness, nipple discharge, back pain, or any other associated symptoms.   History reviewed. No pertinent past medical history.  There are no active problems to display for this patient.   Past Surgical History:  Procedure Laterality Date  . BREAST SURGERY    . FINGER SURGERY      OB History    No data available       Home Medications    Prior to Admission medications   Medication Sig Start Date End Date Taking? Authorizing Provider  amoxicillin (AMOXIL) 875 MG tablet Take 1 tablet (875 mg total) by mouth 2 (two) times daily. 12/27/15   Lysbeth Penner, FNP  clindamycin (CLEOCIN) 150 MG capsule Take 1 capsule (150 mg total) by mouth 4 (four) times daily. 12/22/15   Billy Fischer, MD  clotrimazole-betamethasone (LOTRISONE) cream Apply to affected area 2 times daily 04/13/15   Melony Overly, MD  diphenoxylate-atropine (LOMOTIL) 2.5-0.025  MG per tablet Take 2 tablets by mouth 4 (four) times daily as needed for diarrhea or loose stools. 02/27/14   Billy Fischer, MD  doxycycline (VIBRAMYCIN) 100 MG capsule Take 1 capsule (100 mg total) by mouth 2 (two) times daily. 06/17/15   Billy Fischer, MD  fexofenadine (ALLEGRA) 60 MG tablet Take 1 tablet (60 mg total) by mouth 2 (two) times daily. 07/21/11 07/20/12  Rosana Hoes, MD  ipratropium (ATROVENT) 0.06 % nasal spray Place 2 sprays into both nostrils 4 (four) times daily. 06/17/15   Billy Fischer, MD  neomycin-polymyxin-hydrocortisone (CORTISPORIN) 3.5-10000-1 otic suspension Place 4 drops into the right ear 3 (three) times daily. 06/17/15   Billy Fischer, MD  ondansetron (ZOFRAN) 4 MG tablet Take 1 tablet (4 mg total) by mouth every 6 (six) hours. Prn n/v. 02/27/14   Billy Fischer, MD  trimethoprim-polymyxin b (POLYTRIM) ophthalmic solution Place 1 drop into the right eye every 4 (four) hours. 04/29/15   Konrad Felix, PA    Family History No family history on file.  Social History Social History  Substance Use Topics  . Smoking status: Never Smoker  . Smokeless tobacco: Never Used  . Alcohol use No     Allergies   Patient has no known allergies.  Review of Systems Review of Systems  Constitutional: Negative for chills and fever.  Respiratory: Negative for cough and shortness of breath.   Cardiovascular: Positive for chest pain.  Gastrointestinal:  Negative for diarrhea, nausea and vomiting.  Musculoskeletal: Negative for back pain.  Neurological: Negative for dizziness.     Physical Exam Updated Vital Signs BP 125/59 (BP Location: Left Arm)   Pulse 77   Temp 98.2 F (36.8 C) (Oral)   Resp 18   Ht 5\' 6"  (1.676 m)   Wt 110 lb (49.9 kg)   LMP 02/04/2016   SpO2 99%   BMI 17.75 kg/m   Physical Exam  Constitutional: She appears well-developed and well-nourished.  HENT:  Head: Normocephalic.  Eyes: Conjunctivae are normal.  Cardiovascular: Normal rate, regular rhythm and  normal heart sounds.  Exam reveals no gallop and no friction rub.   No murmur heard. Pulmonary/Chest: Effort normal. No respiratory distress. She exhibits tenderness. Right breast exhibits no mass.  Right breast is non-enlarged. No palpable masses. Nipple unremarkable. Right parasternal tenderness consistent with costochondritis.  Abdominal: She exhibits no distension.  Musculoskeletal: Normal range of motion.  Neurological: She is alert.  Skin: Skin is warm and dry.  Psychiatric: She has a normal mood and affect. Her behavior is normal.  Nursing note and vitals reviewed.    ED Treatments / Results  DIAGNOSTIC STUDIES: Oxygen Saturation is 99% on RA, normal by my interpretation.    COORDINATION OF CARE: 1:36 AM Discussed treatment plan with pt at bedside and pt agreed to plan.  Labs (all labs ordered are listed, but only abnormal results are displayed) Labs Reviewed  BASIC METABOLIC PANEL - Abnormal; Notable for the following:       Result Value   Glucose, Bld 129 (*)    All other components within normal limits  CBC - Abnormal; Notable for the following:    Hemoglobin 11.4 (*)    HCT 35.8 (*)    MCH 25.1 (*)    All other components within normal limits  I-STAT TROPOININ, ED    EKG  EKG Interpretation None       Radiology Dg Chest 2 View  Result Date: 02/13/2016 CLINICAL DATA:  Pt presents with c/o chest pain that started around 8 am. Pt denies any N/V/D, also denies shortness of breath or dizziness. Pt describes the pain as heaviness in the center of her chest. Non-smoker. EXAM: CHEST  2 VIEW COMPARISON:  None. FINDINGS: Cardiomediastinal silhouette is normal in size and configuration. Lungs are clear. Lung volumes are normal. No evidence of pneumonia. No pleural effusion. No pneumothorax. Mild dextroscoliosis of the thoracic spine. No acute or suspicious osseous finding. IMPRESSION: No active cardiopulmonary disease. No evidence of pneumonia or pulmonary edema.  Electronically Signed   By: Franki Cabot M.D.   On: 02/13/2016 21:57    Procedures Procedures (including critical care time)  Medications Ordered in ED Medications - No data to display   Initial Impression / Assessment and Plan / ED Course  I have reviewed the triage vital signs and the nursing notes.  Pertinent labs & imaging results that were available during my care of the patient were reviewed by me and considered in my medical decision making (see chart for details).     Patient presents with constant chest pain since this morning. Chest is tender over right parasternal area, labs neg, CXR clear, no other symptoms. Findings c/w costochondritis.  Final Clinical Impressions(s) / ED Diagnoses   Final diagnoses:  None   1. Costochondritis  New Prescriptions New Prescriptions   No medications on file   I personally performed the services described in this documentation, which was scribed in  my presence. The recorded information has been reviewed and is accurate.      Charlann Lange, PA-C [id number]    Delora Fuel, MD [id number]

## 2016-04-25 ENCOUNTER — Ambulatory Visit (HOSPITAL_COMMUNITY)
Admission: EM | Admit: 2016-04-25 | Discharge: 2016-04-25 | Disposition: A | Discharge status: Home / Self Care | Payer: Medicaid Other | Attending: Family Medicine | Admitting: Family Medicine

## 2016-04-25 DIAGNOSIS — H10023 Other mucopurulent conjunctivitis, bilateral: Secondary | ICD-10-CM

## 2016-04-25 DIAGNOSIS — H10019 Acute follicular conjunctivitis, unspecified eye: Secondary | ICD-10-CM

## 2016-04-25 MED ORDER — TOBRAMYCIN-DEXAMETHASONE 0.3-0.1 % OP SUSP: 1.0000 [drp] | Freq: Four times a day (QID) | OPHTHALMIC | 0 refills | Status: AC

## 2016-04-25 NOTE — ED Provider Notes (Signed)
[facility]    CSN: [id number] Arrival date & time: 04/25/16  1204     History   Chief Complaint Chief Complaint  Patient presents with  . Conjunctivitis    may be allergies    HPI Ebony Simmons is a 26 y.o. female.   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 She developed itching in her eyes with watering overnight and then had crusting this morning in the medial canthus, worse on the right side. His had no significant redness however. The lids are swollen however.      No past medical history on file.  There are no active problems to display for this patient.   Past Surgical History:  Procedure Laterality Date  . BREAST SURGERY    . FINGER SURGERY      OB History    No data available       Home Medications    Prior to Admission medications   Medication Sig Start Date End Date Taking? Authorizing Provider  clotrimazole-betamethasone (LOTRISONE) cream Apply to affected area 2 times daily 04/13/15   Melony Overly, MD  fexofenadine (ALLEGRA) 60 MG tablet Take 1 tablet (60 mg total) by mouth 2 (two) times daily. 07/21/11 07/20/12  Rosana Hoes, MD  ibuprofen (ADVIL,MOTRIN) 600 MG tablet Take 1 tablet (600 mg total) by mouth every 6 (six) hours as needed. 02/14/16   Charlann Lange, PA-C  ipratropium (ATROVENT) 0.06 % nasal spray Place 2 sprays into both nostrils 4 (four) times daily. 06/17/15   Billy Fischer, MD  tobramycin-dexamethasone [facility]) ophthalmic solution Place 1 drop into both eyes every 6 (six) hours. 04/25/16   Robyn Haber, MD    Family History No family history on file.  Social History Social History  Substance Use Topics  . Smoking status: Never Smoker  . Smokeless tobacco: Never Used  . Alcohol use No     Allergies   Clindamycin/lincomycin   Review of Systems Review of Systems  Constitutional: Negative.   HENT: Negative.   Eyes: Positive for photophobia, discharge and itching.  All other systems reviewed and are  negative.    Physical Exam Triage Vital Signs ED Triage Vitals [04/25/16 1255]  Enc Vitals Group     BP 120/76     Pulse Rate 80     Resp      Temp 98 F (36.7 C)     Temp Source Oral     SpO2 100 %     Weight      Height      Head Circumference      Peak Flow      Pain Score      Pain Loc      Pain Edu?      Excl. in [place]?    No data found.   Updated Vital Signs BP 120/76 (BP Location: Right Arm)   Pulse 80   Temp 98 F (36.7 C) (Oral)   SpO2 100%   Visual Acuity Right Eye Distance: 20/20 Left Eye Distance: 20/20 Bilateral Near:  20/20 (with glasses)  Physical Exam  Constitutional: She is oriented to person, place, and time. She appears well-developed.  HENT:  Right Ear: External ear normal.  Left Ear: External ear normal.  Mouth/Throat: Oropharynx is clear and moist.  Eyes: Conjunctivae are normal. Pupils are equal, round, and reactive to light.  Lids are swollen. There is no injection. There is some cobblestoning on the inferior margins of the lower  lids.  Neck: Normal range of motion. Neck supple.  Pulmonary/Chest: Effort normal.  Musculoskeletal: Normal range of motion.  Neurological: She is alert and oriented to person, place, and time.  Skin: Skin is warm and dry.  Nursing note and vitals reviewed.    UC Treatments / Results  Labs (all labs ordered are listed, but only abnormal results are displayed) Labs Reviewed - No data to display  EKG  EKG Interpretation None       Radiology No results found.  Procedures Procedures (including critical care time)  Medications Ordered in UC Medications - No data to display   Initial Impression / Assessment and Plan / UC Course  I have reviewed the triage vital signs and the nursing notes.  Pertinent labs & imaging results that were available during my care of the patient were reviewed by me and considered in my medical decision making (see chart for details).     Final Clinical  Impressions(s) / UC Diagnoses   Final diagnoses:  Pink eye disease of both eyes  Acute follicular conjunctivitis, unspecified laterality    New Prescriptions New Prescriptions   TOBRAMYCIN-DEXAMETHASONE (TOBRADEX) OPHTHALMIC SOLUTION    Place 1 drop into both eyes every 6 (six) hours.     Robyn Haber, MD 04/25/16 [phone]

## 2018-07-20 IMAGING — CR DG CHEST 2V
2 series · 2 of 2 positions shown · non-contrast
Comparison: None.

CLINICAL DATA: Pt presents with c/o chest pain that started around
8 am. Pt denies any N/V/D, also denies shortness of breath or
dizziness. Pt describes the pain as heaviness in the center of her
chest. Non-smoker.

EXAM:
CHEST  2 VIEW

[w chest pa]
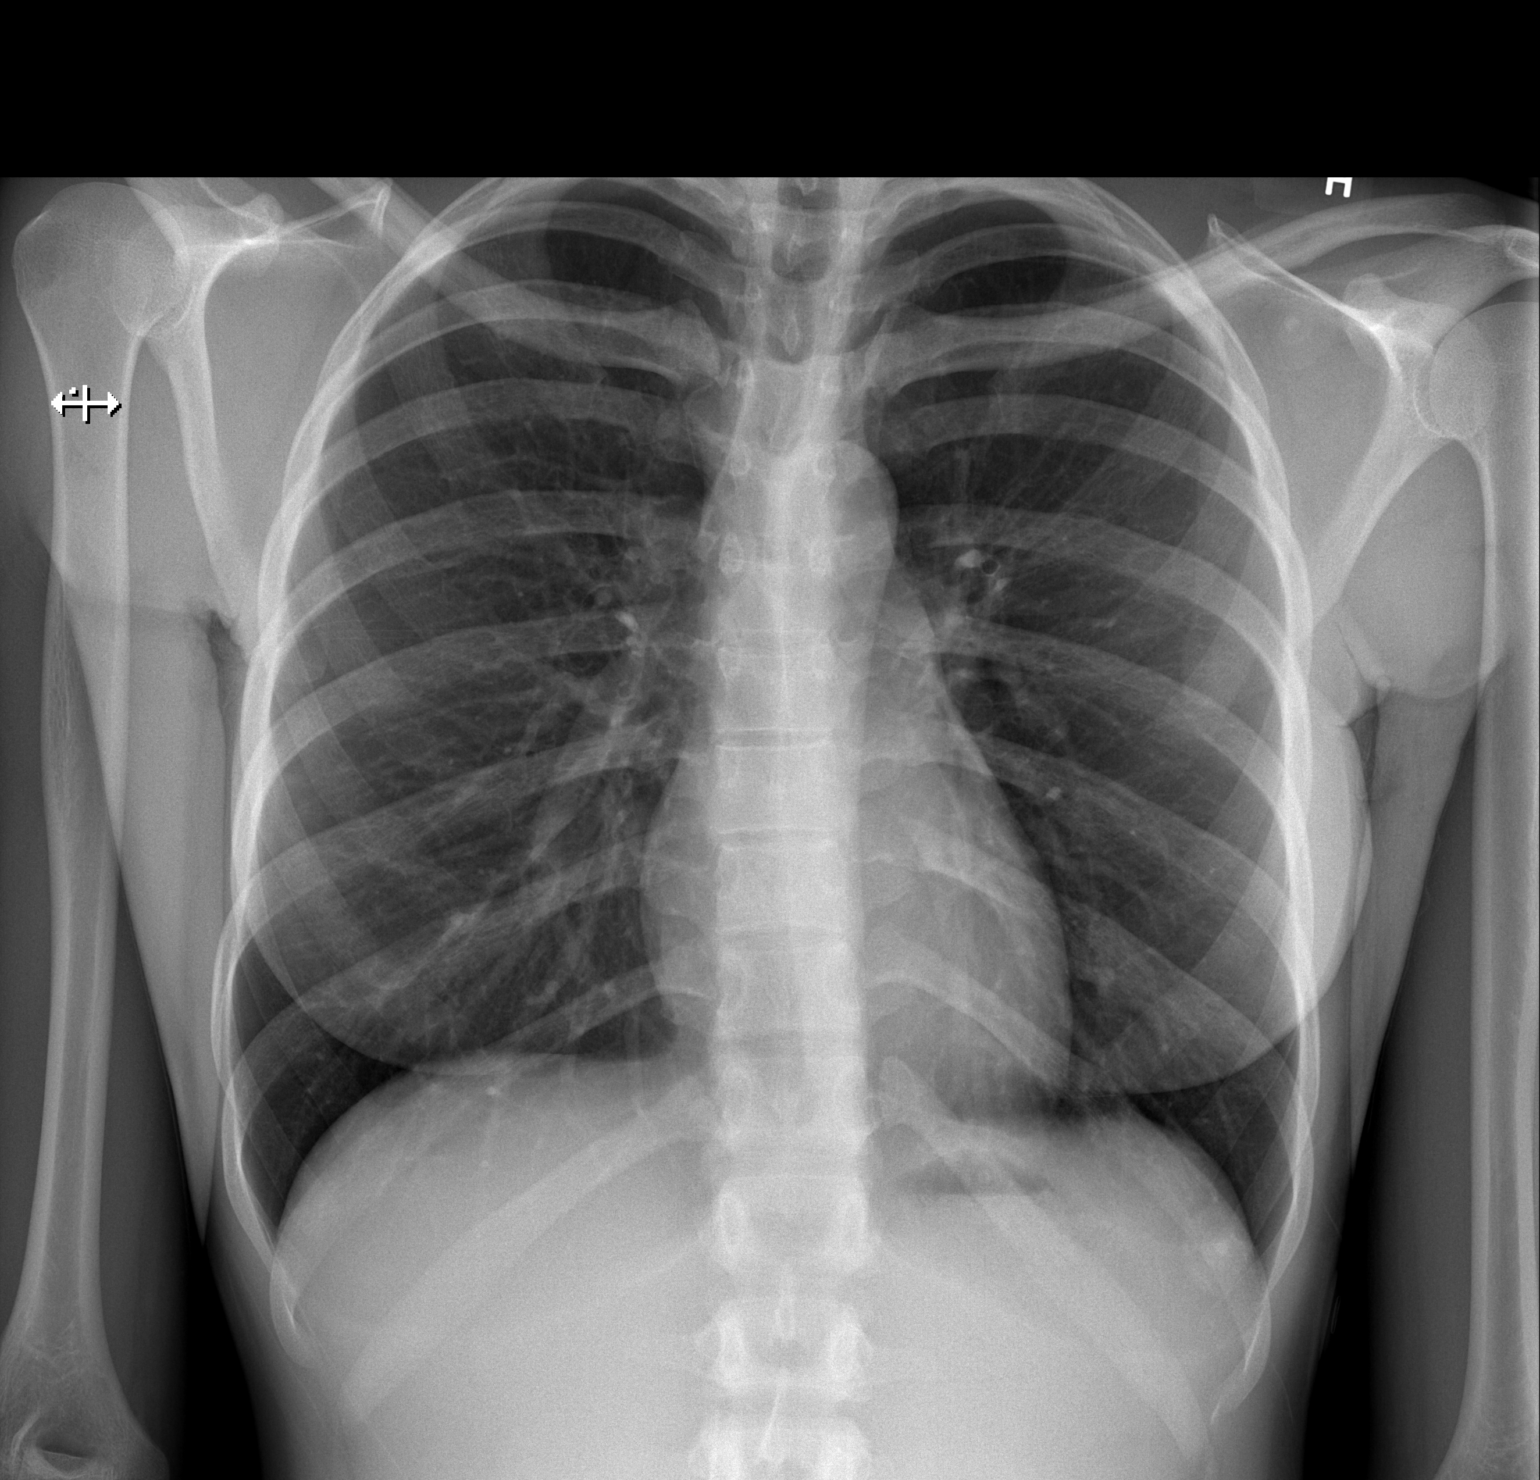

[w chest lat]
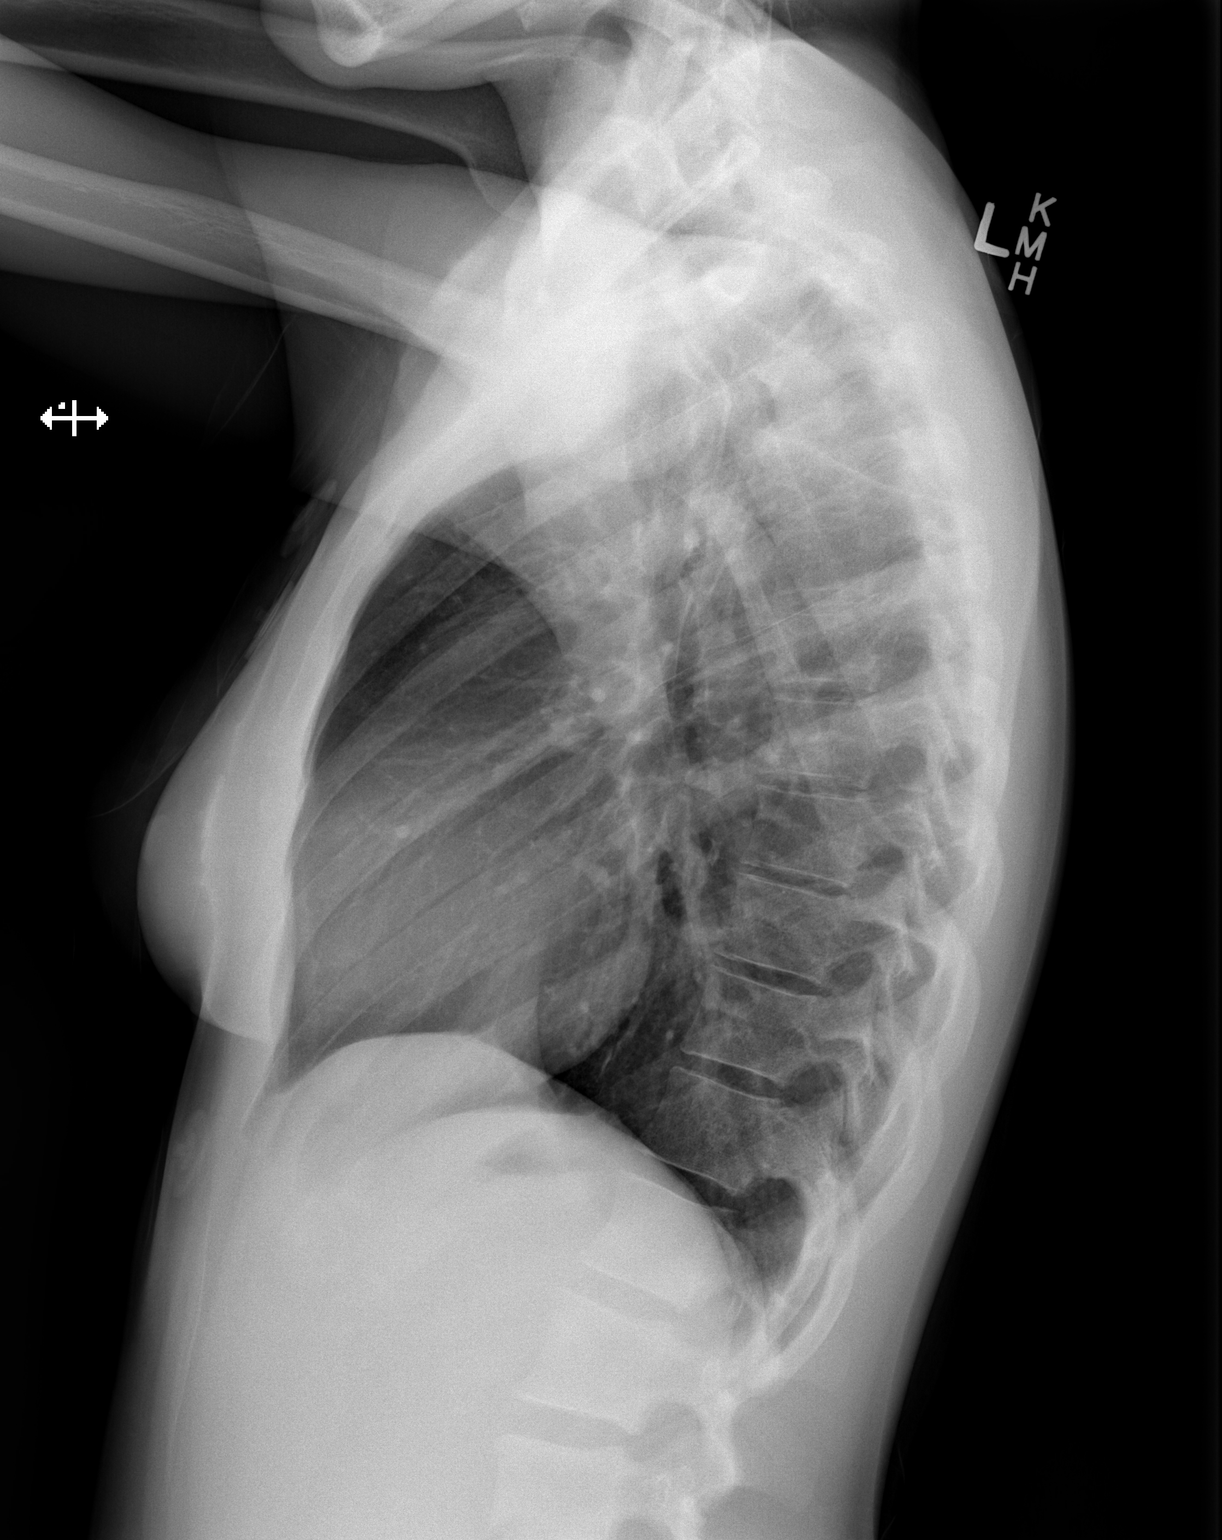

[2 of 2 positions shown; findings below may reference images not displayed]

FINDINGS: Cardiomediastinal silhouette is normal in size and configuration.
Lungs are clear. Lung volumes are normal. No evidence of pneumonia.
No pleural effusion. No pneumothorax.

Mild dextroscoliosis of the thoracic spine. No acute or suspicious
osseous finding.
IMPRESSION: No active cardiopulmonary disease. No evidence of pneumonia or
pulmonary edema.
